# Patient Record
Sex: Female | Born: 1980 | Race: White | Hispanic: No | Marital: Married
Health system: Midwestern US, Community
[De-identification: ages and names within clinical notes are randomized; demographics above are authoritative.]

## PROBLEM LIST (undated history)

## (undated) DIAGNOSIS — D6800 Von Willebrand disease, unspecified: Secondary | ICD-10-CM

## (undated) HISTORY — PX: TONSILLECTOMY: SUR1361

## (undated) HISTORY — PX: WISDOM TOOTH EXTRACTION: SHX21

---

## 2004-11-30 ENCOUNTER — Emergency Department: Payer: Self-pay | Admitting: Emergency Medicine

## 2005-08-30 ENCOUNTER — Emergency Department: Payer: Self-pay | Admitting: Emergency Medicine

## 2005-10-09 ENCOUNTER — Emergency Department: Payer: Self-pay | Admitting: Emergency Medicine

## 2006-06-03 ENCOUNTER — Emergency Department: Payer: Self-pay | Admitting: Emergency Medicine

## 2006-06-06 ENCOUNTER — Emergency Department: Payer: Self-pay | Admitting: Unknown Physician Specialty

## 2008-04-04 ENCOUNTER — Emergency Department: Payer: Self-pay | Admitting: Emergency Medicine

## 2008-12-20 IMAGING — CR DG KNEE COMPLETE 4+V*L*
1 series · 4 of 4 positions shown · non-contrast
Comparison: none

REASON FOR EXAM: (L) knee pain since injury yesterday
COMMENTS:

[Series 1: view not recorded · 0.17mm/px · 4 of 4 slices shown]
[im 1/4]
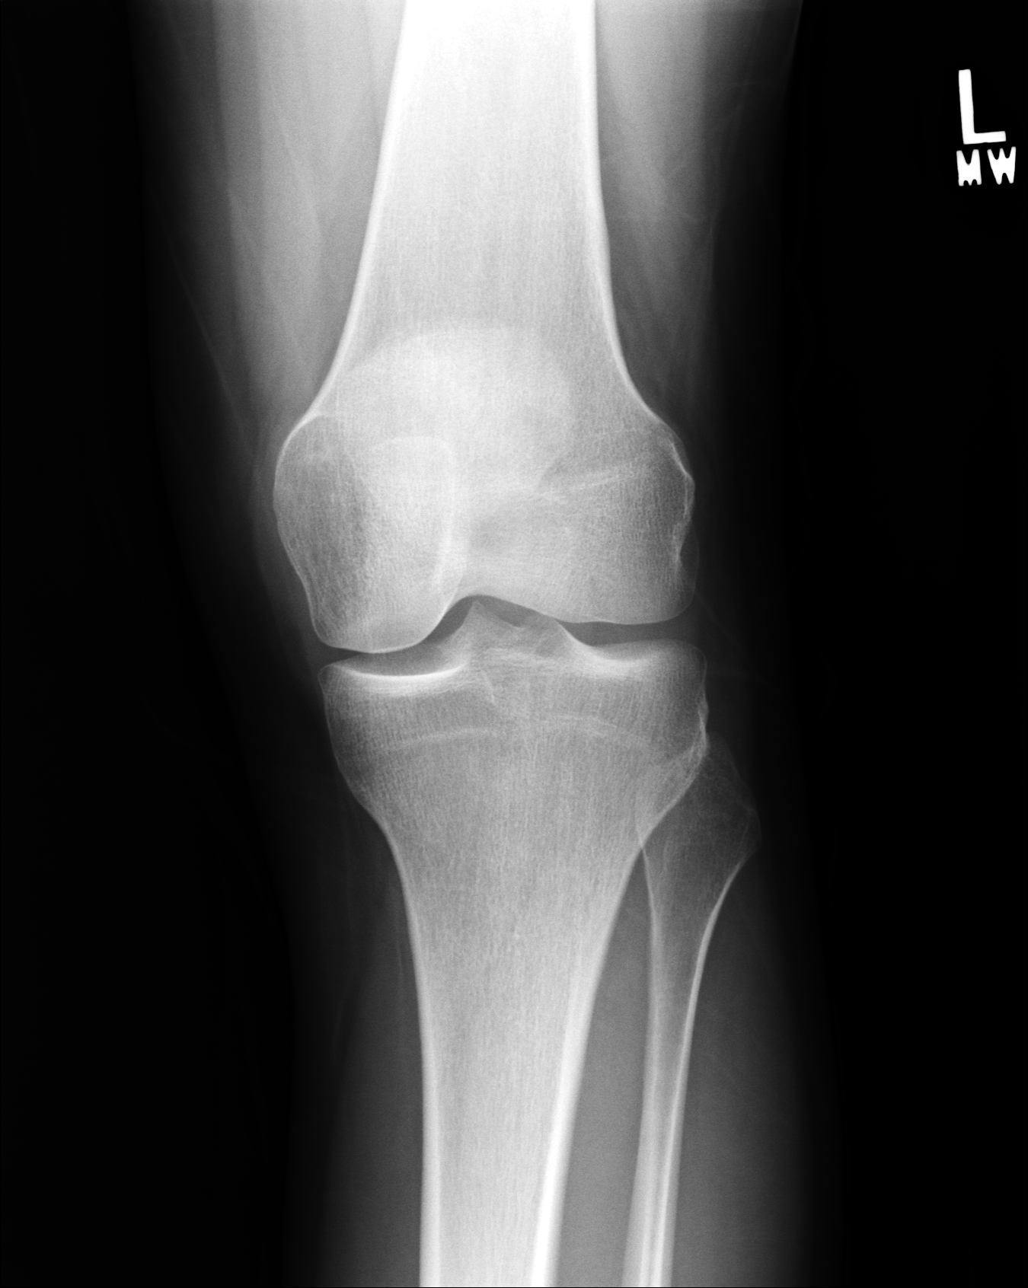
[im 2/4]
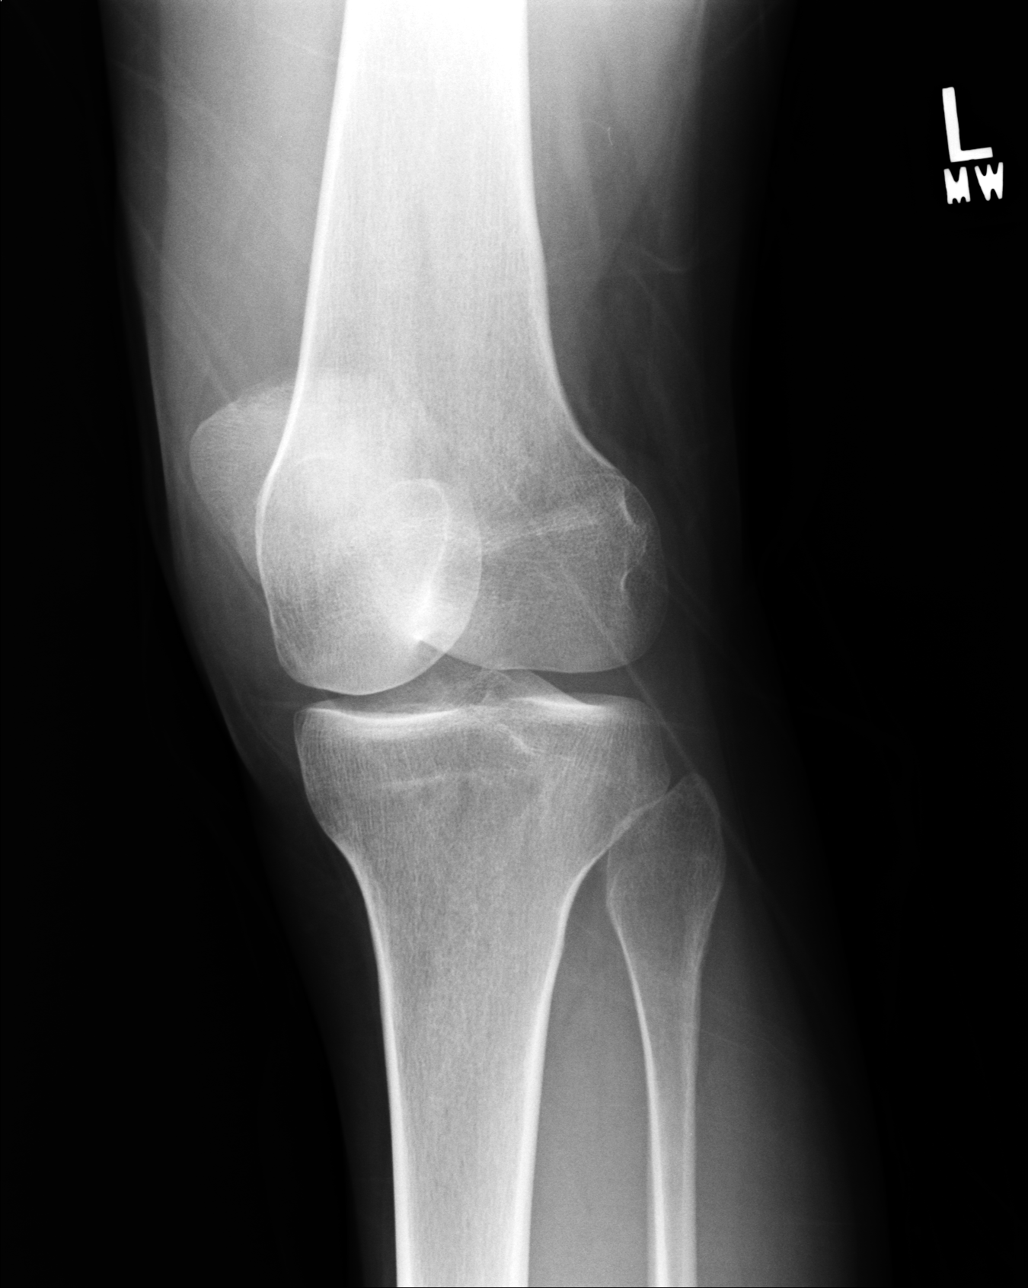
[im 3/4]
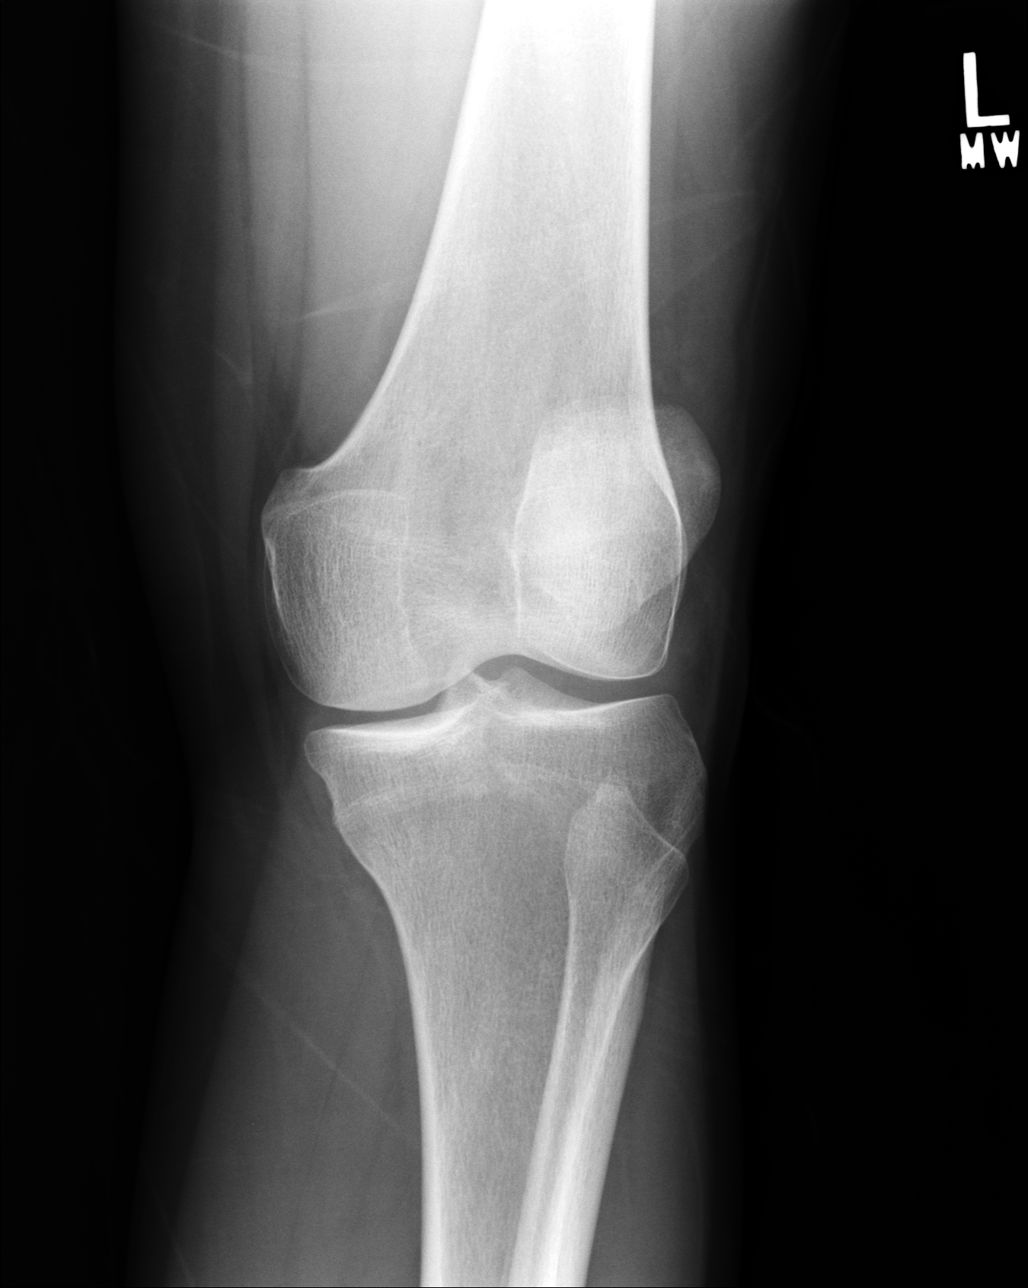
[im 4/4]
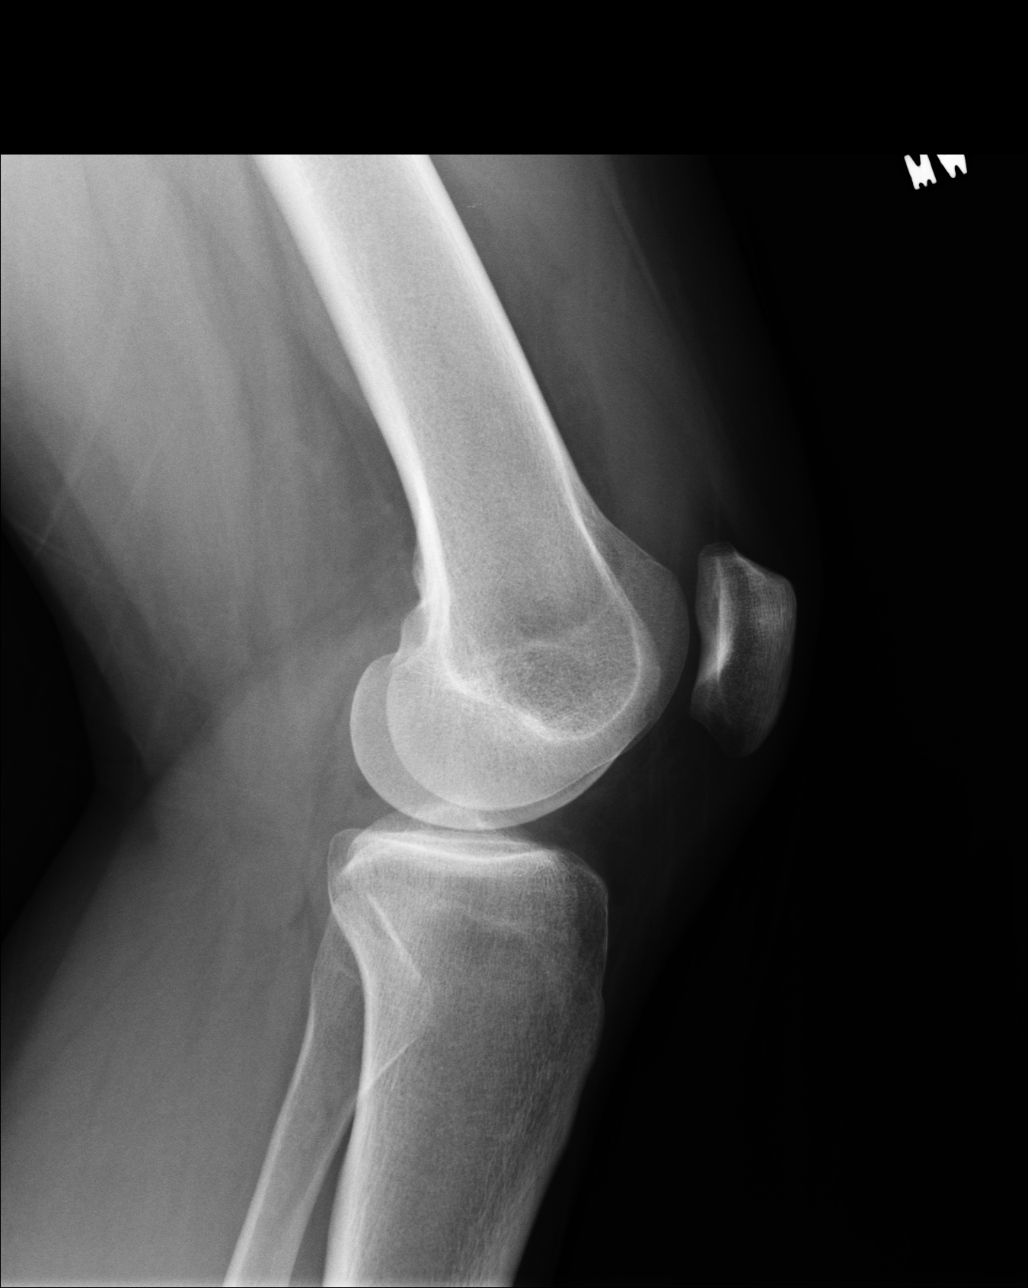

[4 of 4 positions shown; findings below may reference images not displayed]

PROCEDURE:     DXR - DXR KNEE LT COMP WITH OBLIQUES  - April 04, 2008  [DATE]

RESULT:     Four views of the LEFT knee reveal the bones to be adequately
mineralized. I do not see evidence of an acute fracture. There is mild
beaking of the medial tibial spine. I cannot exclude a small joint effusion.
The patella is grossly intact.
IMPRESSION: I see no acute bony abnormality of the LEFT knee. A small
effusion cannot be excluded in the appropriate clinical setting.

## 2010-05-28 ENCOUNTER — Emergency Department: Payer: Self-pay | Admitting: Internal Medicine

## 2010-07-02 ENCOUNTER — Emergency Department: Payer: Self-pay | Admitting: Emergency Medicine

## 2011-03-19 IMAGING — CR DG CHEST 2V
1 series · 3 of 3 positions shown · non-contrast
Comparison: none

REASON FOR EXAM: pain
COMMENTS:

PROCEDURE:     DXR - DXR CHEST PA (OR AP) AND LATERAL  - July 02, 2010  [DATE]
RESULT:     The lung fields are clear. The heart, mediastinal osseous
structures show no acute changes.

[Series 1: view not recorded · 0.17mm/px · 3 of 3 slices shown]
[im 1/3]
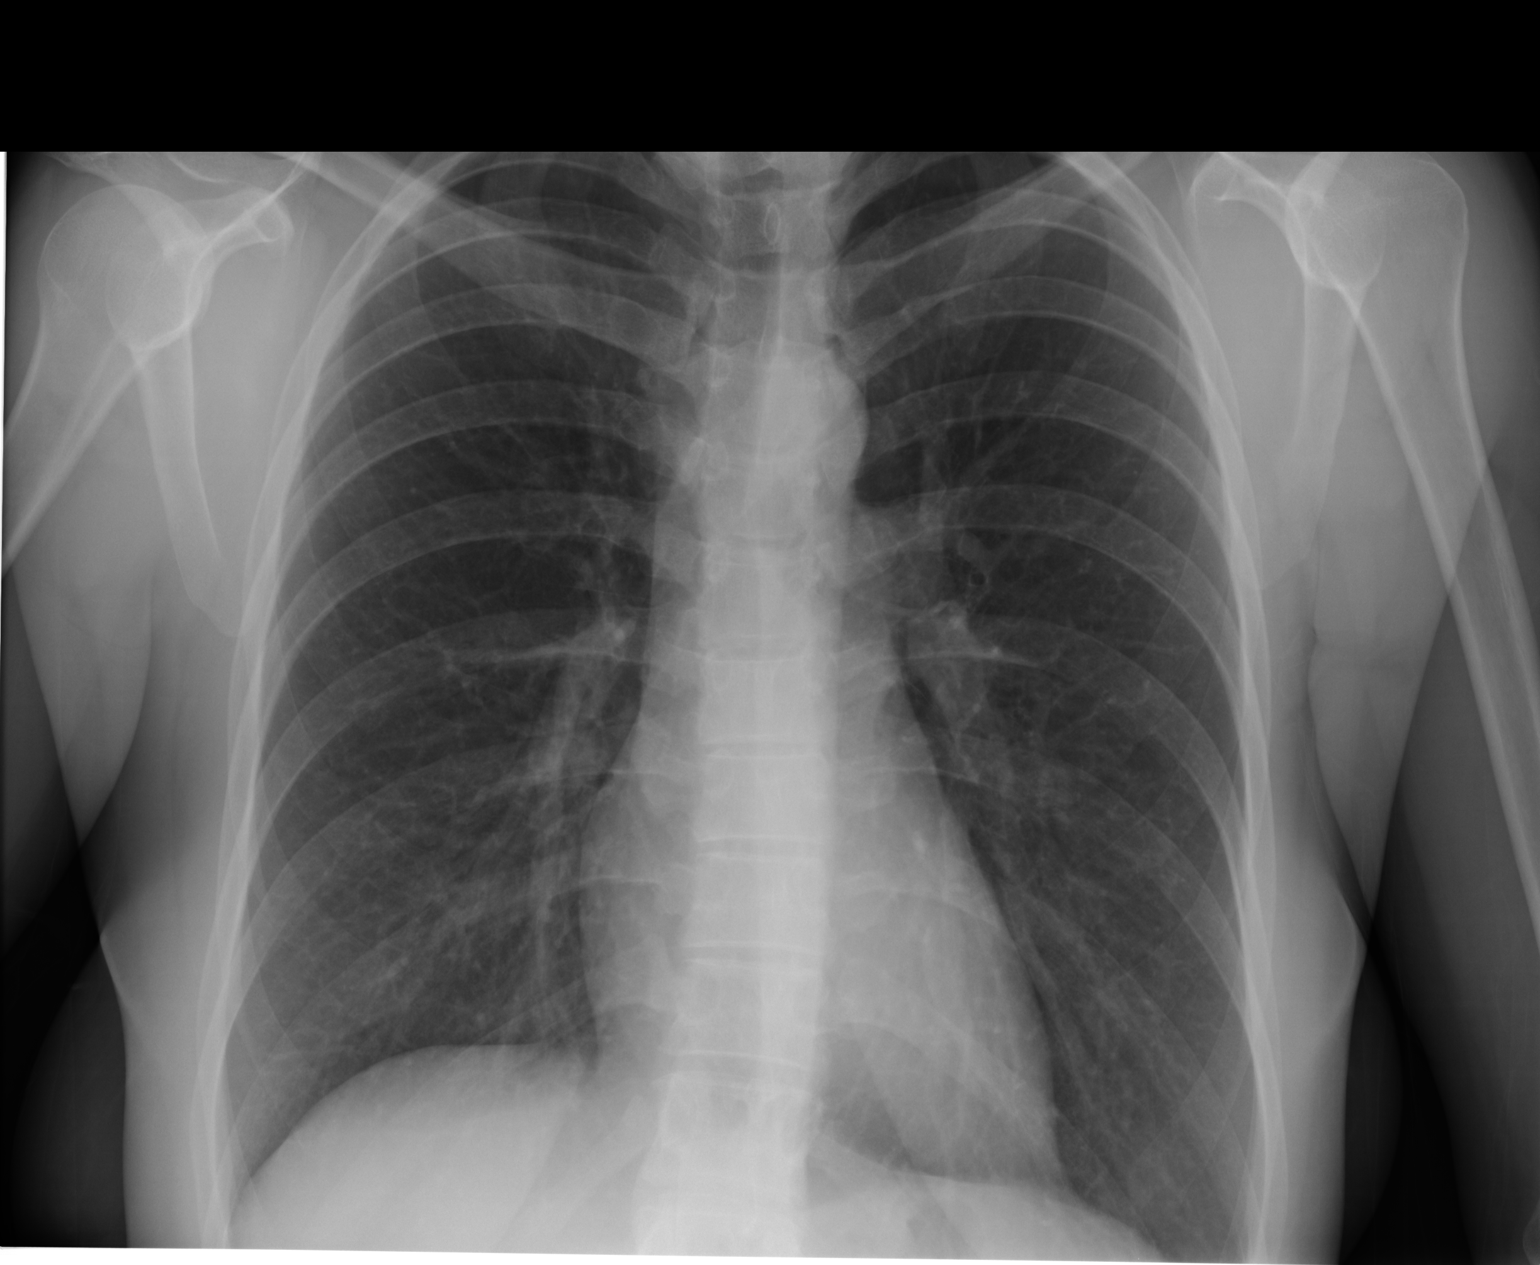
[im 2/3]
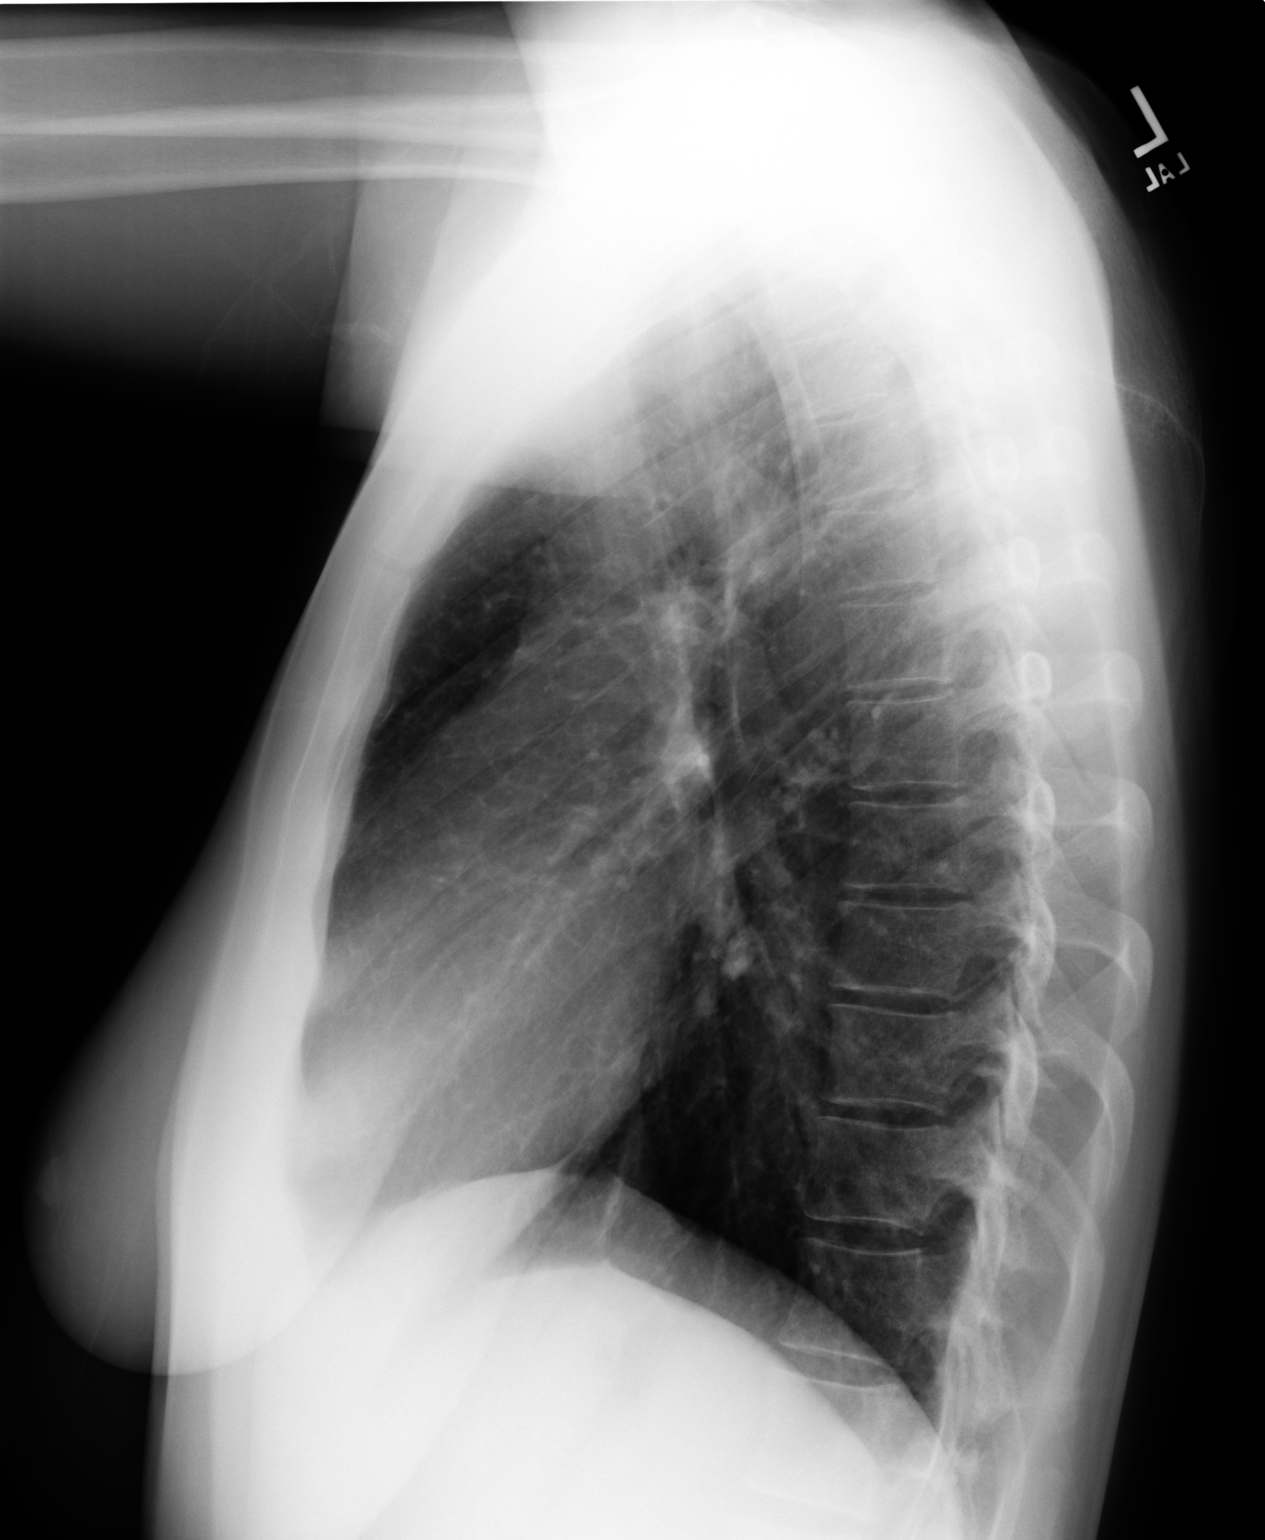
[im 3/3]
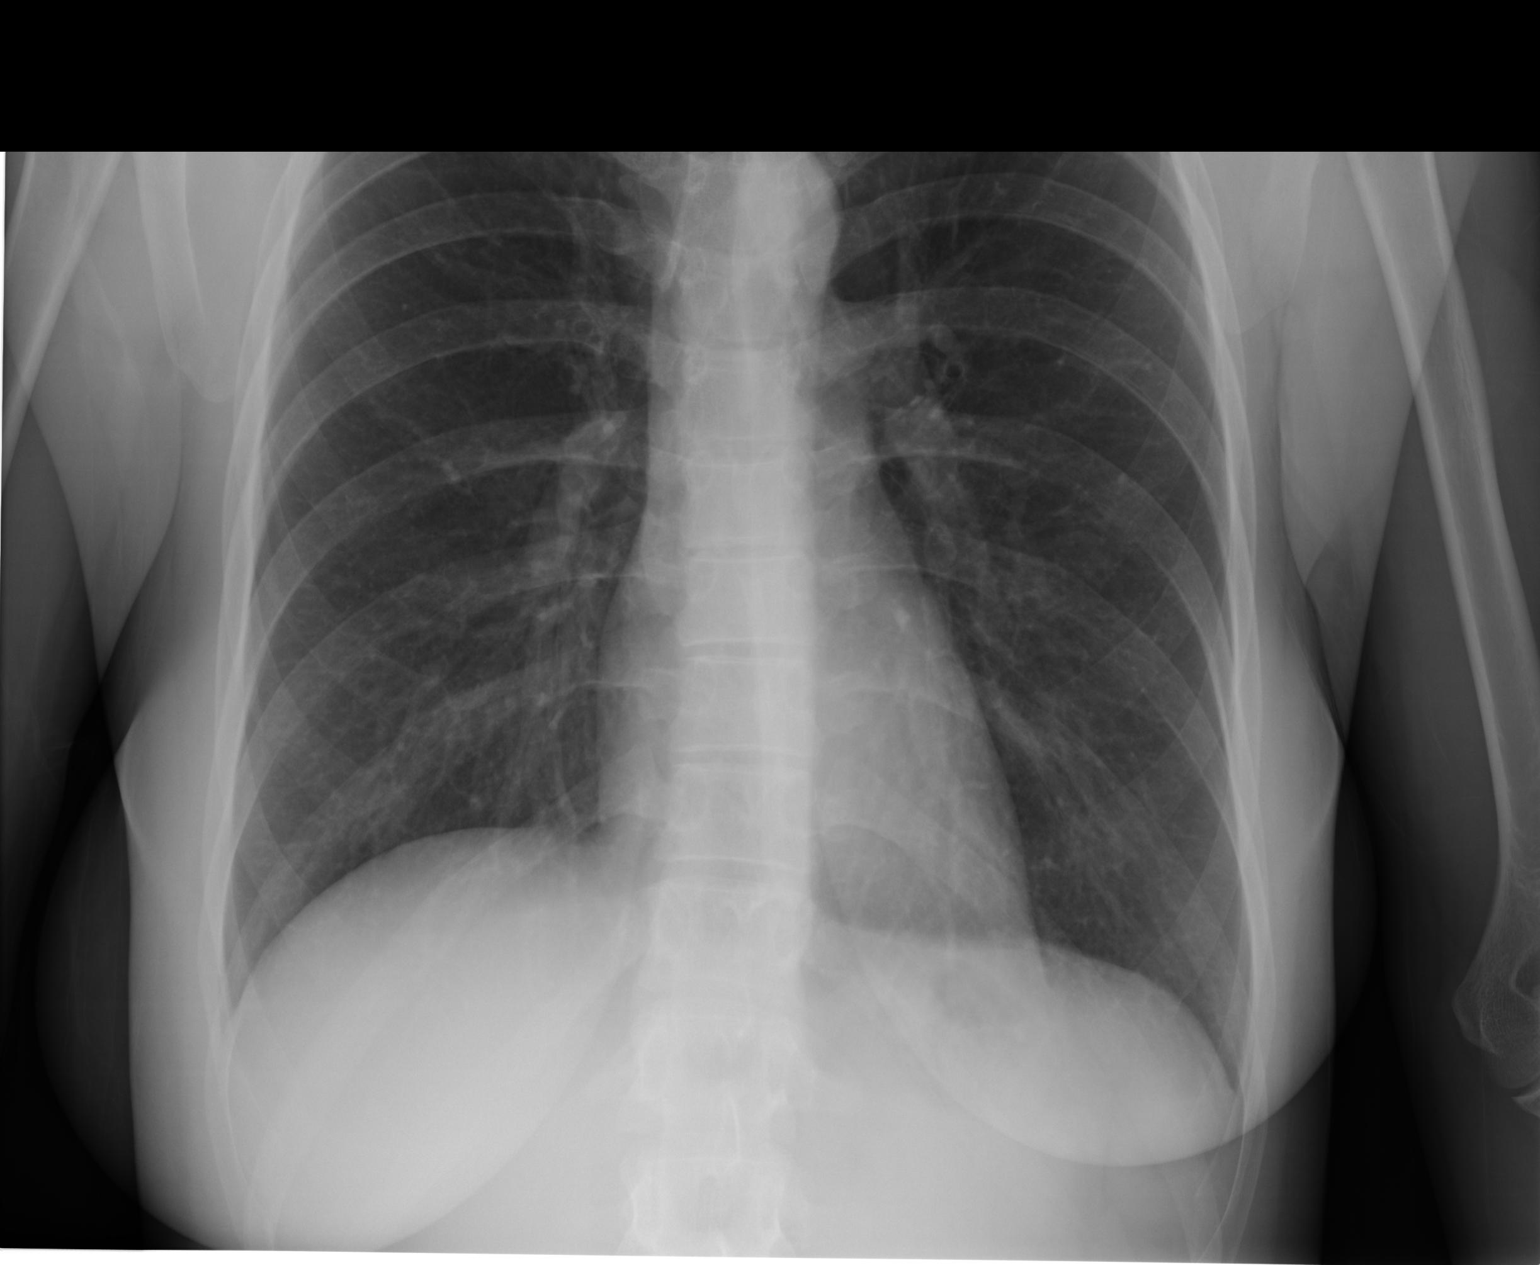

[3 of 3 positions shown; findings below may reference images not displayed]

IMPRESSION: No acute changes are identified.

## 2014-04-12 ENCOUNTER — Ambulatory Visit: Payer: Self-pay | Admitting: Physician Assistant

## 2014-04-12 LAB — URINALYSIS, COMPLETE
BACTERIA: NEGATIVE
Bilirubin,UR: NEGATIVE
Glucose,UR: NEGATIVE
Ketone: NEGATIVE
Leukocyte Esterase: NEGATIVE
Nitrite: NEGATIVE
PROTEIN: NEGATIVE
Ph: 5.5 (ref 5.0–8.0)
WBC UR: NONE SEEN /HPF (ref 0–5)

## 2014-04-25 ENCOUNTER — Ambulatory Visit: Payer: Self-pay | Admitting: Physician Assistant

## 2014-05-11 ENCOUNTER — Ambulatory Visit: Payer: Self-pay | Admitting: Family Medicine

## 2015-01-16 ENCOUNTER — Emergency Department: Payer: Worker's Compensation

## 2015-01-16 ENCOUNTER — Emergency Department
Admission: EM | Admit: 2015-01-16 | Discharge: 2015-01-16 | Disposition: A | Discharge status: Home / Self Care | Payer: Worker's Compensation | Attending: Emergency Medicine | Admitting: Emergency Medicine

## 2015-01-16 ENCOUNTER — Encounter: Payer: Self-pay | Admitting: Emergency Medicine

## 2015-01-16 DIAGNOSIS — W231XXA Caught, crushed, jammed, or pinched between stationary objects, initial encounter: Secondary | ICD-10-CM | POA: Diagnosis not present

## 2015-01-16 DIAGNOSIS — Z23 Encounter for immunization: Secondary | ICD-10-CM | POA: Diagnosis not present

## 2015-01-16 DIAGNOSIS — Y9289 Other specified places as the place of occurrence of the external cause: Secondary | ICD-10-CM | POA: Insufficient documentation

## 2015-01-16 DIAGNOSIS — S6000XA Contusion of unspecified finger without damage to nail, initial encounter: Secondary | ICD-10-CM

## 2015-01-16 DIAGNOSIS — IMO0002 Reserved for concepts with insufficient information to code with codable children: Secondary | ICD-10-CM

## 2015-01-16 DIAGNOSIS — S6991XA Unspecified injury of right wrist, hand and finger(s), initial encounter: Secondary | ICD-10-CM | POA: Diagnosis present

## 2015-01-16 DIAGNOSIS — S61312A Laceration without foreign body of right middle finger with damage to nail, initial encounter: Secondary | ICD-10-CM | POA: Insufficient documentation

## 2015-01-16 DIAGNOSIS — Y990 Civilian activity done for income or pay: Secondary | ICD-10-CM | POA: Insufficient documentation

## 2015-01-16 DIAGNOSIS — S60131A Contusion of right middle finger with damage to nail, initial encounter: Secondary | ICD-10-CM | POA: Diagnosis not present

## 2015-01-16 DIAGNOSIS — Y9389 Activity, other specified: Secondary | ICD-10-CM | POA: Insufficient documentation

## 2015-01-16 HISTORY — DX: Von Willebrand's disease: D68.0

## 2015-01-16 HISTORY — DX: Von Willebrand disease, unspecified: D68.00

## 2015-01-16 MED ORDER — OXYCODONE-ACETAMINOPHEN 5-325 MG PO TABS: 1.0000 | ORAL_TABLET | Freq: Four times a day (QID) | ORAL | Status: AC | PRN

## 2015-01-16 MED ORDER — ONDANSETRON 4 MG PO TBDP
ORAL_TABLET | ORAL | Status: AC
  Administered 2015-01-16: 4 mg via ORAL
  Filled 2015-01-16: qty 1

## 2015-01-16 MED ORDER — NITROGLYCERIN 0.4 MG SL SUBL
SUBLINGUAL_TABLET | SUBLINGUAL | Status: AC
  Filled 2015-01-16: qty 1

## 2015-01-16 MED ORDER — TETANUS-DIPHTH-ACELL PERTUSSIS 5-2.5-18.5 LF-MCG/0.5 IM SUSP
0.5000 mL | Freq: Once | INTRAMUSCULAR | Status: AC
  Administered 2015-01-16: 0.5 mL via INTRAMUSCULAR
  Filled 2015-01-16: qty 0.5

## 2015-01-16 MED ORDER — HYDROMORPHONE HCL 1 MG/ML IJ SOLN
INTRAMUSCULAR | Status: AC
  Filled 2015-01-16: qty 1

## 2015-01-16 MED ORDER — OXYCODONE-ACETAMINOPHEN 5-325 MG PO TABS
2.0000 | ORAL_TABLET | Freq: Once | ORAL | Status: AC
  Administered 2015-01-16: 2 via ORAL

## 2015-01-16 MED ORDER — LIDOCAINE HCL (PF) 1 % IJ SOLN
5.0000 mL | Freq: Once | INTRAMUSCULAR | Status: AC
  Administered 2015-01-16: 5 mL via INTRADERMAL

## 2015-01-16 MED ORDER — OXYCODONE-ACETAMINOPHEN 5-325 MG PO TABS
ORAL_TABLET | ORAL | Status: AC
  Administered 2015-01-16: 2 via ORAL
  Filled 2015-01-16: qty 2

## 2015-01-16 MED ORDER — LIDOCAINE HCL (PF) 1 % IJ SOLN
INTRAMUSCULAR | Status: AC
  Administered 2015-01-16: 5 mL via INTRADERMAL
  Filled 2015-01-16: qty 5

## 2015-01-16 MED ORDER — CLOPIDOGREL BISULFATE 75 MG PO TABS
ORAL_TABLET | ORAL | Status: AC
  Filled 2015-01-16: qty 1

## 2015-01-16 MED ORDER — ONDANSETRON HCL 4 MG/2ML IJ SOLN
INTRAMUSCULAR | Status: AC
  Filled 2015-01-16: qty 2

## 2015-01-16 MED ORDER — TETANUS-DIPHTH-ACELL PERTUSSIS 5-2.5-18.5 LF-MCG/0.5 IM SUSP
INTRAMUSCULAR | Status: AC
  Filled 2015-01-16: qty 0.5

## 2015-01-16 MED ORDER — ONDANSETRON 4 MG PO TBDP
4.0000 mg | ORAL_TABLET | Freq: Once | ORAL | Status: AC
  Administered 2015-01-16: 4 mg via ORAL

## 2015-01-16 NOTE — ED Notes (Signed)
Patient ambulatory to triage with steady gait, without difficulty or distress noted; pt reports smashed right 3rd finger between 2 magnets; employeed with [employer] (profile indicates UDS required)

## 2015-01-16 NOTE — Discharge Instructions (Signed)
Fingertip Injuries and Amputations °Fingertip injuries are common and often get injured because they are last to escape when pulling your hand out of harm's way. You have amputated (cut off) part of your finger. How this turns out depends largely on how much was amputated. If just the tip is amputated, often the end of the finger will grow back and the finger may return to much the same as it was before the injury.  °If more of the finger is missing, your caregiver has done the best with the tissue remaining to allow you to keep as much finger as is possible. Your caregiver after checking your injury has tried to leave you with a painless fingertip that has durable, feeling skin. If possible, your caregiver has tried to maintain the finger's length and appearance and preserve its fingernail.  °Please read the instructions outlined below and refer to this sheet in the next few weeks. These instructions provide you with general information on caring for yourself. Your caregiver may also give you specific instructions. While your treatment has been done according to the most current medical practices available, unavoidable complications occasionally occur. If you have any problems or questions after discharge, please call your caregiver. °HOME CARE INSTRUCTIONS  °· You may resume normal diet and activities as directed or allowed. °· Keep your hand elevated above the level of your heart. This helps decrease pain and swelling. °· Keep ice packs (or a bag of ice wrapped in a towel) on the injured area for 15-20 minutes, 03-04 times per day, for the first two days. °· Change dressings if necessary or as directed. °· Clean the wound daily or as directed. °· Only take over-the-counter or prescription medicines for pain, discomfort, or fever as directed by your caregiver. °· Keep appointments as directed. °SEEK IMMEDIATE MEDICAL CARE IF: °· You develop redness, swelling, numbness or increasing pain in the wound. °· There is  pus coming from the wound. °· You develop an unexplained oral temperature above 102° F (38.9° C) or as your caregiver suggests. °· There is a foul (bad) smell coming from the wound or dressing. °· There is a breaking open of the wound (edges not staying together) after sutures or staples have been removed. °MAKE SURE YOU:  °· Understand these instructions. °· Will watch your condition. °· Will get help right away if you are not doing well or get worse. °Document Released: 02/27/2005 Document Revised: 07/01/2011 Document Reviewed: 01/27/2008 °[employer]® Patient Information ©2015 [employer]. This information is not intended to replace advice given to you by your health care provider. Make sure you discuss any questions you have with your health care provider. ° °

## 2015-01-16 NOTE — ED Provider Notes (Signed)
[facility] Emergency Department Provider Note     Time seen: ----------------------------------------- 7:17 AM on 01/16/2015 -----------------------------------------    I have reviewed the triage vital signs and the nursing notes.   HISTORY  Chief Complaint Finger Injury    HPI Christine Thomas is a 34 y.o. female who presents to ER after she smashed her right middle finger between 2 magnets at work. Patient is complaining of severe pain to the distal phalanx of the right third digit, nothing makes it better or worse.   Past Medical History  Diagnosis Date  . Von Willebrand disease     There are no active problems to display for this patient.   Past Surgical History  Procedure Laterality Date  . Tonsillectomy    . Wisdom tooth extraction      Allergies Review of patient's allergies indicates no known allergies.  Social History Social History  Substance Use Topics  . Smoking status: None  . Smokeless tobacco: None  . Alcohol Use: None    Review of Systems Musculoskeletal: Positive for right third digit pain Skin: Positive for finger laceration Neurological: Negative for headaches, focal weakness or numbness.   ____________________________________________   PHYSICAL EXAM:  VITAL SIGNS: ED Triage Vitals  Enc Vitals Group     BP 01/16/15 0659 123/90 mmHg     Pulse Rate 01/16/15 0659 88     Resp 01/16/15 0659 18     Temp 01/16/15 0659 98 F (36.7 C)     Temp src --      SpO2 01/16/15 0659 99 %     Weight 01/16/15 0659 150 lb (68.04 kg)     Height 01/16/15 0659  (1.778 m)     Head Cir --      Peak Flow --      Pain Score 01/16/15 0659 10     Pain Loc --      Pain Edu? --      Excl. in GC? --     Constitutional: Alert and oriented. Well appearing and in no distress. Musculoskeletal: Right third digit pain range of motion. There is a fingertip deformity. Neurologic:  Normal speech and language. No gross  focal neurologic deficits are appreciated. Speech is normal. No gait instability. Skin: There is no subungual hematoma. There is distal phalanx laceration  ____________________________________________  ED COURSE:  Pertinent labs & imaging results that were available during my care of the patient were reviewed by me and considered in my medical decision making (see chart for details). LACERATION REPAIR Performed by: Emily Filbert Authorized by: Daryel November E Consent: Verbal consent obtained. Risks and benefits: risks, benefits and alternatives were discussed Consent given by: patient Patient identity confirmed: provided demographic data Prepped and Draped in normal sterile fashion Wound explored  Laceration Location: Right third digit fingertip  Laceration Length: 1.5 cm  No Foreign Bodies seen or palpated  Anesthesia: digital block  Local anesthetic: lidocaine 1 % without epinephrine  Anesthetic total: 5 ml  Irrigation method: syringe Amount of cleaning: standard  Skin closure: 5-0 Ethilon   Number of sutures: 3   Technique: Simple interrupted   Patient tolerance: Patient tolerated the procedure well with no immediate complications. ____________________________________________   RADIOLOGY Images were viewed by me  IMPRESSION: 1. No acute bone abnormality 2. Soft tissue laceration  ____________________________________________  FINAL ASSESSMENT AND PLAN  Fingertip injury, laceration  Plan: Patient with labs and imaging as dictated above. Wound was repaired. She needs topical  antibiotic ointment and suture removal in 10 days. Patient is agreeable to plan.   Emily Filbert, MD   Emily Filbert, MD 01/16/15 [phone]

## 2015-01-16 NOTE — ED Notes (Signed)
md to bedside. Right finger #3 with injury to tip. Not bleeding. Percocet and zofran given.

## 2017-01-02 LAB — HM HIV SCREENING LAB: HM HIV Screening: NEGATIVE

## 2017-01-17 ENCOUNTER — Ambulatory Visit: Payer: Self-pay

## 2017-01-17 ENCOUNTER — Other Ambulatory Visit: Payer: Self-pay | Admitting: Occupational Medicine

## 2017-01-17 DIAGNOSIS — Z0000 Encounter for general adult medical examination without abnormal findings: Secondary | ICD-10-CM

## 2017-10-04 IMAGING — DX DG CHEST 1V
1 series · 1 of 1 positions shown · non-contrast
Comparison: July 02, 2010

CLINICAL DATA: Physical examination.  History of tobacco use

EXAM:
CHEST 1 VIEW

[chest pa]
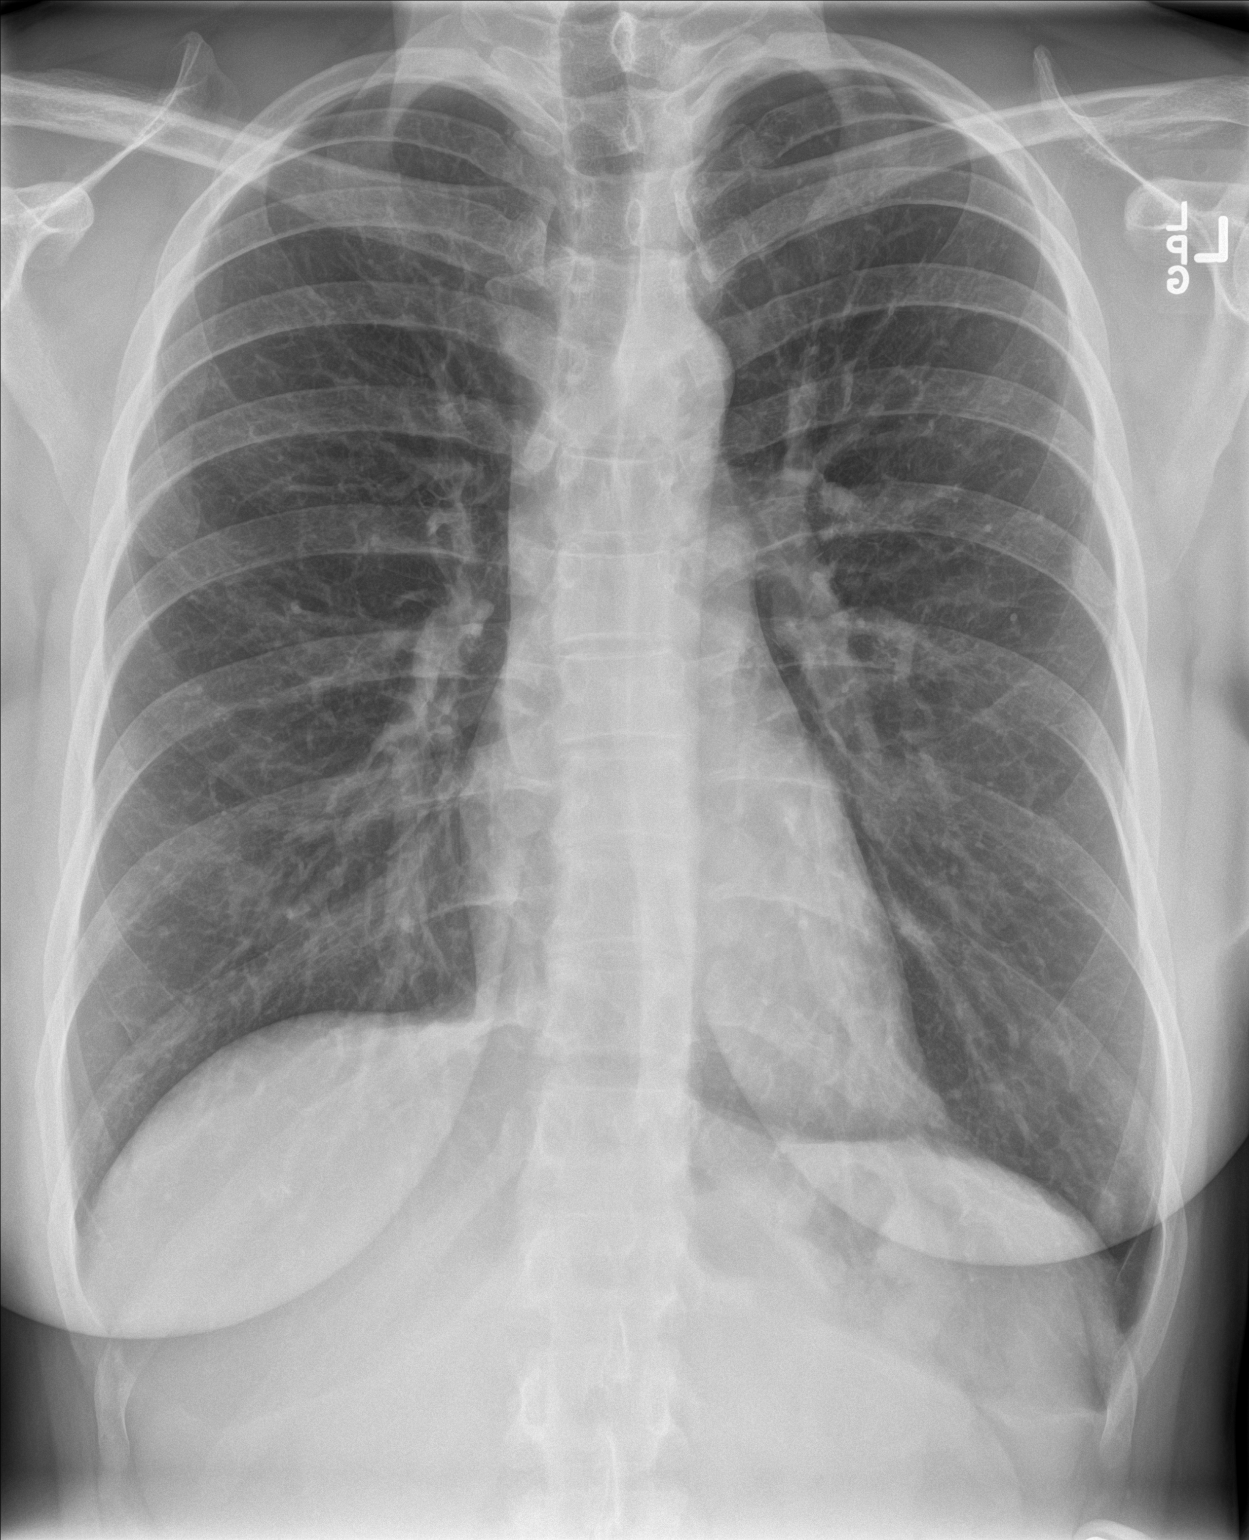

[1 of 1 positions shown; findings below may reference images not displayed]

FINDINGS: Lungs are clear. Heart size and pulmonary vascularity are normal. No
adenopathy. No bone lesions.
IMPRESSION: No edema or consolidation.

## 2018-12-30 ENCOUNTER — Encounter: Payer: Self-pay | Admitting: Family Medicine

## 2018-12-30 ENCOUNTER — Other Ambulatory Visit: Payer: Self-pay

## 2018-12-30 ENCOUNTER — Ambulatory Visit: Payer: Self-pay | Admitting: Family Medicine

## 2018-12-30 DIAGNOSIS — Z113 Encounter for screening for infections with a predominantly sexual mode of transmission: Secondary | ICD-10-CM

## 2018-12-30 LAB — WET PREP FOR TRICH, YEAST, CLUE
Clue Cell Exam: POSITIVE — AB
Trichomonas Exam: NEGATIVE
Yeast Exam: NEGATIVE

## 2018-12-30 NOTE — Progress Notes (Signed)
    STI clinic/screening visit  Subjective:  Christine Thomas is a 38 y.o. female being seen today for an STI screening visit. The patient reports they do not have symptoms.  Patient has the following medical conditions:  There are no active problems to display for this patient.    Chief Complaint  Patient presents with  . SEXUALLY TRANSMITTED DISEASE    HPI  Patient reports here for STD testing today, no sympts.  See flowsheet for further details and programmatic requirements.    The following portions of the patient's history were reviewed and updated as appropriate: allergies, current medications, past medical history, past social history, past surgical history and problem list.  Objective:  There were no vitals filed for this visit.  Physical Exam Constitutional:      Appearance: Normal appearance.  HENT:     Mouth/Throat:     Mouth: Mucous membranes are moist.     Pharynx: Oropharynx is clear. No oropharyngeal exudate or posterior oropharyngeal erythema.  Neck:     Musculoskeletal: No muscular tenderness.  Abdominal:     Tenderness: There is no abdominal tenderness.  Genitourinary:    General: Normal vulva.     Vagina: No vaginal discharge.  Lymphadenopathy:     Cervical: No cervical adenopathy.  Skin:    General: Skin is warm and dry.     Findings: No lesion or rash.  Neurological:     Mental Status: She is alert.    Assessment and Plan:  Christine Thomas is a 38 y.o. female presenting to the [facility] Department for STI screening  1. Screening examination for venereal disease  - WET PREP FOR TRICH, YEAST, CLUE - Gonococcus culture - HIV [facility] - Syphilis Serology, [facility] - Chlamydia/Gonorrhea [facility] Treat wet prep as per SO.    No follow-ups on file.  No future appointments.  Hassell Done, FNP

## 2018-12-30 NOTE — Progress Notes (Signed)
Here today for STD screening. Accepts bloodwork. Hal Morales, RN Wet Prep results reviewed. Per standing order no treatment indicated Hal Morales, RN

## 2019-01-04 LAB — GONOCOCCUS CULTURE

## 2022-07-27 ENCOUNTER — Emergency Department: Admit: 2022-07-27

## 2022-07-27 ENCOUNTER — Inpatient Hospital Stay
Admit: 2022-07-27 | Discharge: 2022-07-27 | Disposition: A | Discharge status: Home / Self Care | Attending: Emergency Medicine

## 2022-07-27 DIAGNOSIS — J4541 Moderate persistent asthma with (acute) exacerbation: Secondary | ICD-10-CM

## 2022-07-27 LAB — CBC WITH AUTO DIFFERENTIAL
Basophils %: 1 % (ref 0.0–2.0)
Basophils Absolute: 0.05 10*3/uL (ref 0.00–0.20)
Eosinophils %: 1 % (ref 0–6)
Eosinophils Absolute: 0.06 10*3/uL (ref 0.05–0.50)
Hematocrit: 40.7 % (ref 34.0–48.0)
Hemoglobin: 14 g/dL (ref 11.5–15.5)
Immature Granulocytes %: 0 % (ref 0.0–5.0)
Immature Granulocytes Absolute: 0.04 10*3/uL (ref 0.00–0.58)
Lymphocytes %: 25 % (ref 20.0–42.0)
Lymphocytes Absolute: 2.5 10*3/uL (ref 1.50–4.00)
MCH: 33 pg (ref 26.0–35.0)
MCHC: 34.4 g/dL (ref 32.0–34.5)
MCV: 96 fL (ref 80.0–99.9)
MPV: 9 fL (ref 7.0–12.0)
Monocytes %: 11 % (ref 2.0–12.0)
Monocytes Absolute: 1.1 10*3/uL — ABNORMAL HIGH (ref 0.10–0.95)
Neutrophils %: 62 % (ref 43.0–80.0)
Neutrophils Absolute: 6.12 10*3/uL (ref 1.80–7.30)
Platelets: 366 10*3/uL (ref 130–450)
RBC: 4.24 m/uL (ref 3.50–5.50)
RDW: 14 % (ref 11.5–15.0)
WBC: 9.9 10*3/uL (ref 4.5–11.5)

## 2022-07-27 LAB — COVID-19 & INFLUENZA COMBO
INFLUENZA A: NOT DETECTED
INFLUENZA B: NOT DETECTED
SARS-CoV-2 RNA, RT PCR: NOT DETECTED

## 2022-07-27 LAB — COMPREHENSIVE METABOLIC PANEL
ALT: 31 U/L (ref 0–32)
AST: 42 U/L — ABNORMAL HIGH (ref 0–31)
Albumin: 3.8 g/dL (ref 3.5–5.2)
Alkaline Phosphatase: 98 U/L (ref 35–104)
Anion Gap: 18 mmol/L — ABNORMAL HIGH (ref 7–16)
BUN: 9 mg/dL (ref 6–20)
CO2: 21 mmol/L — ABNORMAL LOW (ref 22–29)
Calcium: 9.2 mg/dL (ref 8.6–10.2)
Chloride: 99 mmol/L (ref 98–107)
Creatinine: 0.6 mg/dL (ref 0.50–1.00)
Est, Glom Filt Rate: >90 mL/min/{1.73_m2} (ref >60)
Glucose: 142 mg/dL — ABNORMAL HIGH (ref 74–99)
Potassium: 3.7 mmol/L (ref 3.5–5.0)
Sodium: 138 mmol/L (ref 132–146)
Total Bilirubin: 0.6 mg/dL (ref 0.0–1.2)
Total Protein: 6.3 g/dL — ABNORMAL LOW (ref 6.4–8.3)

## 2022-07-27 LAB — BRAIN NATRIURETIC PEPTIDE: Pro-BNP: 58 pg/mL (ref 0–125)

## 2022-07-27 LAB — TROPONIN: Troponin, High Sensitivity: <6 ng/L (ref 0–9)

## 2022-07-27 LAB — D-DIMER, QUANTITATIVE: D-Dimer, Quant: <200 ng/mL DDU (ref 0–232)

## 2022-07-27 LAB — TSH: TSH: 1.69 u[IU]/mL (ref 0.27–4.20)

## 2022-07-27 LAB — RSV DETECTION: RSV by PCR: NOT DETECTED

## 2022-07-27 LAB — PREGNANCY, URINE: HCG(Urine) Pregnancy Test: NEGATIVE

## 2022-07-27 LAB — MAGNESIUM: Magnesium: 1.2 mg/dL — ABNORMAL LOW (ref 1.6–2.6)

## 2022-07-27 MED ORDER — MAGNESIUM SULFATE 2000 MG/50 ML IVPB PREMIX
2 | Freq: Once | INTRAVENOUS | Status: AC
Start: 2022-07-27 — End: 2022-07-27
  Administered 2022-07-27: 16:00:00 2000 mg via INTRAVENOUS

## 2022-07-27 MED ORDER — AZITHROMYCIN 500 MG PO TABS
500 | ORAL_TABLET | Freq: Every day | ORAL | 0 refills | Status: AC
Start: 2022-07-27 — End: 2022-07-30

## 2022-07-27 MED ORDER — PREDNISONE 50 MG PO TABS
50 | ORAL_TABLET | Freq: Every day | ORAL | 0 refills | Status: AC
Start: 2022-07-27 — End: 2022-08-01

## 2022-07-27 MED ORDER — STERILE WATER FOR INJECTION (MIXTURES ONLY)
125 | Freq: Once | INTRAMUSCULAR | Status: AC
Start: 2022-07-27 — End: 2022-07-27
  Administered 2022-07-27: 15:00:00 125 mg via INTRAVENOUS

## 2022-07-27 MED ORDER — ALBUTEROL SULFATE HFA 108 (90 BASE) MCG/ACT IN AERS
108 | Freq: Four times a day (QID) | RESPIRATORY_TRACT | 0 refills | Status: AC | PRN
Start: 2022-07-27 — End: ?

## 2022-07-27 MED ORDER — BUDESONIDE-FORMOTEROL FUMARATE 160-4.5 MCG/ACT IN AERO
Freq: Two times a day (BID) | RESPIRATORY_TRACT | 0 refills | Status: AC
Start: 2022-07-27 — End: ?

## 2022-07-27 MED ORDER — IPRATROPIUM-ALBUTEROL 0.5-2.5 (3) MG/3ML IN SOLN
RESPIRATORY_TRACT | Status: AC
Start: 2022-07-27 — End: 2022-07-27
  Administered 2022-07-27 (×3): 1 via RESPIRATORY_TRACT

## 2022-07-27 MED FILL — IPRATROPIUM-ALBUTEROL 0.5-2.5 (3) MG/3ML IN SOLN: RESPIRATORY_TRACT | Qty: 6

## 2022-07-27 MED FILL — IPRATROPIUM-ALBUTEROL 0.5-2.5 (3) MG/3ML IN SOLN: RESPIRATORY_TRACT | Qty: 3

## 2022-07-27 MED FILL — METHYLPREDNISOLONE SODIUM SUCC 125 MG IJ SOLR: 125 MG | INTRAMUSCULAR | Qty: 125

## 2022-07-27 MED FILL — MAGNESIUM SULFATE 2 GM/50ML IV SOLN: 2 GM/50ML | INTRAVENOUS | Qty: 50

## 2022-07-27 NOTE — ED Notes (Signed)
Placed patient on cardiac monitor.

## 2022-07-27 NOTE — ED Provider Notes (Incomplete)
[facility] EMERGENCY DEPARTMENT  EMERGENCY DEPARTMENT ENCOUNTER        Pt Name: Amber Maxwell  MRN: [id number]  Birthdate 1980/10/16  Date of evaluation: 07/27/2022  Provider: Herby Abraham, DO  PCP: No primary care provider on file.  Note Started: 9:59 AM EDT 07/27/22    CHIEF COMPLAINT       Chief Complaint   Patient presents with    Shortness of Breath     +SOB and feels like her heart is racing.  Started couple days ago.        HISTORY OF PRESENT ILLNESS: 1 or more Elements     History from : Patient    Limitations to history : None    Amber Maxwell is a 42 y.o. female who has history of htn on lisinopril presents for shortness of breath and palpitations since Thursday. Pt notes she always has sinus issues. No new sinus congestion. Pt notes she has a smokers cough and no change. Pt smokes 1/2-1ppd.  No history of asthma or copd. Pt took her lisnopril today. Pt notes her periods are irregular. Pt notes she has chronic diarrhea for 6 mos.    Nursing Notes were all reviewed and agreed with or any disagreements were addressed in the HPI.    REVIEW OF SYSTEMS :      Review of Systems    Positives and Pertinent negatives as per HPI.     SURGICAL HISTORY     Past Surgical History:   Procedure Laterality Date    TONSILLECTOMY      TUBAL LIGATION         CURRENTMEDICATIONS       Previous Medications    LISINOPRIL (PRINIVIL;ZESTRIL) 40 MG TABLET    Take 1 tablet by mouth daily       ALLERGIES     Patient has no known allergies.    FAMILYHISTORY     History reviewed. No pertinent family history.     SOCIAL HISTORY       Social History     Tobacco Use    Smoking status: Every Day     Types: Cigarettes   Vaping Use    Vaping Use: Never used   Substance Use Topics    Alcohol use: Yes    Drug use: Never       SCREENINGS        Glasgow Coma Scale  Eye Opening: Spontaneous  Best Verbal Response: Oriented  Best Motor Response: Obeys commands  Glasgow Coma Scale Score: 15                CIWA  Assessment  BP: (!) 176/108  Pulse: (!) 104           PHYSICAL EXAM  1 or more Elements     ED Triage Vitals   BP Temp Temp src Pulse Respirations SpO2 Height Weight - Scale   07/27/22 0952 -- -- 07/27/22 0952 07/27/22 0952 07/27/22 0952 07/27/22 0949 07/27/22 0949   (!) 176/108   (!) 104 18 99 % 1.778 m (5\' 10" ) 85.7 kg (189 lb)       Physical Exam  Vitals reviewed.   Constitutional:       General: She is not in acute distress.     Appearance: Normal appearance. She is not toxic-appearing.   HENT:      Head: Normocephalic and atraumatic.      Right Ear: External ear normal.  Left Ear: External ear normal.      Nose: Nose normal. No congestion.      Mouth/Throat:      Mouth: Mucous membranes are moist.      Pharynx: Oropharynx is clear. No posterior oropharyngeal erythema.   Eyes:      Extraocular Movements: Extraocular movements intact.      Pupils: Pupils are equal, round, and reactive to light.   Cardiovascular:      Rate and Rhythm: Regular rhythm. Tachycardia present.      Pulses: Normal pulses.      Heart sounds: No murmur heard.  Pulmonary:      Effort: Pulmonary effort is normal.      Breath sounds: No decreased breath sounds, wheezing, rhonchi or rales.   Chest:      Chest wall: No tenderness.   Abdominal:      General: Bowel sounds are normal.      Tenderness: There is no abdominal tenderness. There is no right CVA tenderness, left CVA tenderness or guarding.   Musculoskeletal:         General: No swelling or deformity.      Cervical back: Normal range of motion and neck supple. No muscular tenderness.   Skin:     General: Skin is warm and dry.      Capillary Refill: Capillary refill takes less than 2 seconds.   Neurological:      General: No focal deficit present.      Mental Status: She is alert and oriented to person, place, and time.   Psychiatric:         Mood and Affect: Mood normal.             DIAGNOSTIC RESULTS   LABS:    Labs Reviewed   COVID-19 & INFLUENZA COMBO   CBC WITH AUTO DIFFERENTIAL    COMPREHENSIVE METABOLIC PANEL   MAGNESIUM   TROPONIN   TSH   D-DIMER, QUANTITATIVE       When ordered only abnormal lab results are displayed. All other labs were within normal range or not returned as of this dictation.    EKG:   0952  Ecg read by me  Sinus tachycardia  Hr 105  Qtc 459  Lvh no acute ischemia    RADIOLOGY:   Non-plain film images such as CT, Ultrasound and MRI are read by the radiologist. Plain radiographic images are visualized and preliminarily interpreted by the ED Provider with the below findings:    ***    Interpretation per the Radiologist below, if available at the time of this note:    XR CHEST (2 VW)    (Results Pending)     No results found.    No results found.    PROCEDURES   Unless otherwise noted below, none     Procedures    CRITICAL CARE TIME   ***    PAST MEDICAL HISTORY      has a past medical history of Hypertension.     EMERGENCY DEPARTMENT COURSE and DIFFERENTIAL DIAGNOSIS/MDM:   Vitals:    Vitals:    07/27/22 0949 07/27/22 0952   BP:  (!) 176/108   Pulse:  (!) 104   Resp:  18   SpO2:  99%   Weight: 85.7 kg (189 lb)    Height: 1.778 m (5\' 10" )        Patient was given the following medications:  Medications - No data to display          @  SEP1ALLCHARTS@    Chronic Conditions: ***    CONSULTS: (Who and What was discussed)  None    {Discussion with Other Profesionals (Optional):52703}    {Social Determinants (Optional):53672}    {Records Reviewed (Optional):52702}    CC/HPI Summary, DDx, ED Course, and Reassessment: ***    Disposition Considerations (tests considered but not done, Shared Decision Making, Pt Expectation of Test or Tx.): ***  {Escalation of care, including admission/OBS considered:53682}      I am the Primary Clinician of Record.    FINAL IMPRESSION    No diagnosis found.      DISPOSITION/PLAN     DISPOSITION        PATIENT REFERRED TO:  No follow-up provider specified.    DISCHARGE MEDICATIONS:  New Prescriptions    No medications on file       DISCONTINUED  MEDICATIONS:  Discontinued Medications    No medications on file              (Please note that portions of this note were completed with a voice recognition program.  Efforts were made to edit the dictations but occasionally words are mis-transcribed.)    Herby Abraham, DO (electronically signed)

## 2022-07-27 NOTE — Discharge Instructions (Addendum)
Call family doctor tomorrow and schedule a followup appointment to be seen in 2 days    Have your doctor obtain  finalized report of all results

## 2022-07-29 LAB — EKG 12-LEAD
QTc Calculation (Bazett): 459 ms
Ventricular Rate: 105 {beats}/min

## 2022-07-30 LAB — EKG 12-LEAD
Atrial Rate: 105 {beats}/min
P Axis: 62 degrees
P-R Interval: 124 ms
Q-T Interval: 348 ms
QRS Duration: 86 ms
R Axis: 65 degrees
T Axis: 68 degrees

## 2022-08-21 ENCOUNTER — Encounter: Attending: Acute Care
